# Patient Record
Sex: Male | Born: 1985 | Race: White | Hispanic: No | Marital: Single | Smoking: Never smoker
Health system: Southern US, Community
[De-identification: ages and names within clinical notes are randomized; demographics above are authoritative.]

## PROBLEM LIST (undated history)

## (undated) DIAGNOSIS — F4310 Post-traumatic stress disorder, unspecified: Secondary | ICD-10-CM

## (undated) HISTORY — PX: HERNIA REPAIR: SHX51

## (undated) HISTORY — PX: SHOULDER SURGERY: SHX246

---

## 2005-04-25 ENCOUNTER — Encounter: Admission: RE | Admit: 2005-04-25 | Discharge: 2005-05-16 | Payer: Self-pay | Admitting: Orthopedic Surgery

## 2011-10-09 ENCOUNTER — Other Ambulatory Visit (HOSPITAL_BASED_OUTPATIENT_CLINIC_OR_DEPARTMENT_OTHER): Payer: Self-pay | Admitting: Nuclear Medicine

## 2011-10-09 ENCOUNTER — Ambulatory Visit (HOSPITAL_BASED_OUTPATIENT_CLINIC_OR_DEPARTMENT_OTHER)
Admission: RE | Admit: 2011-10-09 | Discharge: 2011-10-09 | Disposition: A | Discharge status: Home / Self Care | Payer: BC Managed Care – PPO | Source: Ambulatory Visit | Attending: Nuclear Medicine | Admitting: Nuclear Medicine

## 2011-10-09 DIAGNOSIS — M549 Dorsalgia, unspecified: Secondary | ICD-10-CM

## 2011-10-09 DIAGNOSIS — R350 Frequency of micturition: Secondary | ICD-10-CM

## 2013-08-24 ENCOUNTER — Ambulatory Visit (HOSPITAL_BASED_OUTPATIENT_CLINIC_OR_DEPARTMENT_OTHER)
Admission: RE | Admit: 2013-08-24 | Discharge: 2013-08-24 | Disposition: A | Discharge status: Home / Self Care | Payer: BC Managed Care – PPO | Source: Ambulatory Visit | Attending: Osteopathic Medicine | Admitting: Osteopathic Medicine

## 2013-08-24 ENCOUNTER — Other Ambulatory Visit (HOSPITAL_BASED_OUTPATIENT_CLINIC_OR_DEPARTMENT_OTHER): Payer: Self-pay | Admitting: Osteopathic Medicine

## 2013-08-24 DIAGNOSIS — R52 Pain, unspecified: Secondary | ICD-10-CM

## 2013-08-24 DIAGNOSIS — M948X9 Other specified disorders of cartilage, unspecified sites: Secondary | ICD-10-CM | POA: Insufficient documentation

## 2013-08-24 DIAGNOSIS — M48061 Spinal stenosis, lumbar region without neurogenic claudication: Secondary | ICD-10-CM | POA: Insufficient documentation

## 2013-08-24 DIAGNOSIS — Q762 Congenital spondylolisthesis: Secondary | ICD-10-CM | POA: Insufficient documentation

## 2013-08-24 DIAGNOSIS — M461 Sacroiliitis, not elsewhere classified: Secondary | ICD-10-CM | POA: Insufficient documentation

## 2014-02-25 ENCOUNTER — Ambulatory Visit: Payer: BC Managed Care – PPO | Attending: Neurology | Admitting: Rehabilitation

## 2014-05-19 ENCOUNTER — Emergency Department (HOSPITAL_COMMUNITY): Payer: BC Managed Care – PPO

## 2014-05-19 ENCOUNTER — Encounter (HOSPITAL_COMMUNITY): Payer: Self-pay | Admitting: Emergency Medicine

## 2014-05-19 ENCOUNTER — Emergency Department (HOSPITAL_COMMUNITY)
Admission: EM | Admit: 2014-05-19 | Discharge: 2014-05-19 | Disposition: A | Discharge status: Home / Self Care | Payer: BC Managed Care – PPO | Attending: Emergency Medicine | Admitting: Emergency Medicine

## 2014-05-19 DIAGNOSIS — R112 Nausea with vomiting, unspecified: Secondary | ICD-10-CM | POA: Diagnosis not present

## 2014-05-19 DIAGNOSIS — N200 Calculus of kidney: Secondary | ICD-10-CM

## 2014-05-19 DIAGNOSIS — R1031 Right lower quadrant pain: Secondary | ICD-10-CM | POA: Diagnosis present

## 2014-05-19 DIAGNOSIS — Z79899 Other long term (current) drug therapy: Secondary | ICD-10-CM | POA: Insufficient documentation

## 2014-05-19 LAB — CBC WITH DIFFERENTIAL/PLATELET
Basophils Absolute: 0 10*3/uL (ref 0.0–0.1)
Basophils Relative: 0 % (ref 0–1)
EOS PCT: 11 % — AB (ref 0–5)
Eosinophils Absolute: 0.3 10*3/uL (ref 0.0–0.7)
HEMATOCRIT: 44.9 % (ref 39.0–52.0)
Hemoglobin: 15.8 g/dL (ref 13.0–17.0)
LYMPHS ABS: 1.5 10*3/uL (ref 0.7–4.0)
Lymphocytes Relative: 55 % — ABNORMAL HIGH (ref 12–46)
MCH: 29.1 pg (ref 26.0–34.0)
MCHC: 35.2 g/dL (ref 30.0–36.0)
MCV: 82.7 fL (ref 78.0–100.0)
MONOS PCT: 9 % (ref 3–12)
Monocytes Absolute: 0.2 10*3/uL (ref 0.1–1.0)
NEUTROS ABS: 0.7 10*3/uL — AB (ref 1.7–7.7)
Neutrophils Relative %: 25 % — ABNORMAL LOW (ref 43–77)
Platelets: 221 10*3/uL (ref 150–400)
RBC: 5.43 MIL/uL (ref 4.22–5.81)
RDW: 12.7 % (ref 11.5–15.5)
WBC: 2.7 10*3/uL — ABNORMAL LOW (ref 4.0–10.5)

## 2014-05-19 LAB — COMPREHENSIVE METABOLIC PANEL
ALT: 39 U/L (ref 0–53)
AST: 26 U/L (ref 0–37)
Albumin: 4.3 g/dL (ref 3.5–5.2)
Alkaline Phosphatase: 69 U/L (ref 39–117)
Anion gap: 13 (ref 5–15)
BILIRUBIN TOTAL: 0.8 mg/dL (ref 0.3–1.2)
BUN: 17 mg/dL (ref 6–23)
CHLORIDE: 101 meq/L (ref 96–112)
CO2: 26 mEq/L (ref 19–32)
Calcium: 9.5 mg/dL (ref 8.4–10.5)
Creatinine, Ser: 1.4 mg/dL — ABNORMAL HIGH (ref 0.50–1.35)
GFR calc non Af Amer: 67 mL/min — ABNORMAL LOW (ref >90)
GFR, EST AFRICAN AMERICAN: 78 mL/min — AB (ref >90)
Glucose, Bld: 158 mg/dL — ABNORMAL HIGH (ref 70–99)
Potassium: 4.5 mEq/L (ref 3.7–5.3)
Sodium: 140 mEq/L (ref 137–147)
Total Protein: 7.2 g/dL (ref 6.0–8.3)

## 2014-05-19 LAB — URINALYSIS, ROUTINE W REFLEX MICROSCOPIC
BILIRUBIN URINE: NEGATIVE
Glucose, UA: NEGATIVE mg/dL
KETONES UR: NEGATIVE mg/dL
Leukocytes, UA: NEGATIVE
NITRITE: NEGATIVE
PH: 8 (ref 5.0–8.0)
Protein, ur: NEGATIVE mg/dL
Specific Gravity, Urine: 1.019 (ref 1.005–1.030)
Urobilinogen, UA: 1 mg/dL (ref 0.0–1.0)

## 2014-05-19 LAB — PATHOLOGIST SMEAR REVIEW

## 2014-05-19 LAB — LIPASE, BLOOD: Lipase: 20 U/L (ref 11–59)

## 2014-05-19 LAB — URINE MICROSCOPIC-ADD ON

## 2014-05-19 MED ORDER — SODIUM CHLORIDE 0.9 % IV BOLUS (SEPSIS)
1000.0000 mL | Freq: Once | INTRAVENOUS | Status: AC
  Administered 2014-05-19: 1000 mL via INTRAVENOUS

## 2014-05-19 MED ORDER — KETOROLAC TROMETHAMINE 30 MG/ML IJ SOLN
30.0000 mg | Freq: Once | INTRAMUSCULAR | Status: AC
  Administered 2014-05-19: 30 mg via INTRAVENOUS
  Filled 2014-05-19: qty 1

## 2014-05-19 MED ORDER — HYDROMORPHONE HCL PF 1 MG/ML IJ SOLN
1.0000 mg | Freq: Once | INTRAMUSCULAR | Status: AC
  Administered 2014-05-19: 1 mg via INTRAVENOUS
  Filled 2014-05-19: qty 1

## 2014-05-19 MED ORDER — HYDROCODONE-ACETAMINOPHEN 5-325 MG PO TABS: 1.0000 | ORAL_TABLET | ORAL | Status: DC | PRN

## 2014-05-19 MED ORDER — ONDANSETRON HCL 4 MG/2ML IJ SOLN
4.0000 mg | Freq: Once | INTRAMUSCULAR | Status: AC
  Administered 2014-05-19: 4 mg via INTRAVENOUS
  Filled 2014-05-19: qty 2

## 2014-05-19 MED ORDER — IOHEXOL 300 MG/ML  SOLN: 25.0000 mL | Freq: Once | INTRAMUSCULAR | Status: DC | PRN

## 2014-05-19 MED ORDER — ONDANSETRON 4 MG PO TBDP: 4.0000 mg | ORAL_TABLET | Freq: Three times a day (TID) | ORAL | Status: DC | PRN

## 2014-05-19 NOTE — ED Provider Notes (Signed)
CSN: [id number]     Arrival date & time 05/19/14  0755 History   First MD Initiated Contact with Patient 05/19/14 [phone]     Chief Complaint  Patient presents with  . Abdominal Pain     (Consider location/radiation/quality/duration/timing/severity/associated sxs/prior Treatment) Patient is a 28 y.o. male presenting with abdominal pain. The history is provided by the patient.  Abdominal Pain Pain location:  RLQ Pain quality: sharp and shooting   Pain radiates to:  Does not radiate Pain severity:  Severe Onset quality:  Sudden Duration:  1 hour Timing:  Constant Progression:  Unchanged Chronicity:  New Relieved by:  Nothing Worsened by:  Nothing tried Ineffective treatments:  None tried Associated symptoms: nausea and vomiting   Associated symptoms: no chest pain, no chills, no diarrhea, no fever and no shortness of breath   Risk factors: not obese     28 yo M with a chief complaint right lower quadrant pain. Patient said this pain started abruptly about an hour ago. The pain is sharp severe and doesn't change with anything. Patient with radiation to R flank. Pain worse when he moves around. Patient denies history of kidney stones. Last bowel movement was yesterday and normal. Patient with one episode of emesis upon arrival. Denies history of abdominal surgery. Patient unable to lay still, denies fever, chills.    History reviewed. No pertinent past medical history. Past Surgical History  Procedure Laterality Date  . Shoulder surgery     No family history on file. History  Substance Use Topics  . Smoking status: Never Smoker   . Smokeless tobacco: Not on file  . Alcohol Use: No    Review of Systems  Constitutional: Negative for fever and chills.  HENT: Negative for congestion and facial swelling.   Eyes: Negative for discharge and visual disturbance.  Respiratory: Negative for shortness of breath.   Cardiovascular: Negative for chest pain and palpitations.   Gastrointestinal: Positive for nausea and vomiting. Negative for abdominal pain and diarrhea.  Genitourinary: Negative for penile pain and testicular pain.  Musculoskeletal: Negative for arthralgias and myalgias.  Skin: Negative for color change and rash.  Neurological: Negative for tremors, syncope and headaches.  Psychiatric/Behavioral: Negative for confusion and dysphoric mood.      Allergies  Review of patient's allergies indicates no known allergies.  Home Medications   Prior to Admission medications   Medication Sig Start Date End Date Taking? Authorizing Provider  buPROPion (WELLBUTRIN) 100 MG tablet Take 100 mg by mouth 2 (two) times daily. 03/18/14  Yes Historical Provider, MD  HYDROcodone-acetaminophen (NORCO/VICODIN) 5-325 MG per tablet Take 1 tablet by mouth every 4 (four) hours as needed for moderate pain or severe pain. 05/19/14   Melene Plan, MD  ondansetron (ZOFRAN ODT) 4 MG disintegrating tablet Take 1 tablet (4 mg total) by mouth every 8 (eight) hours as needed for nausea or vomiting. 05/19/14   Melene Plan, MD   BP 91/50  Pulse 55  Temp(Src) 97.9 F (36.6 C) (Oral)  Resp 14  SpO2 97% Physical Exam  Constitutional: He is oriented to person, place, and time. He appears well-developed and well-nourished.  HENT:  Head: Normocephalic and atraumatic.  Eyes: EOM are normal. Pupils are equal, round, and reactive to light.  Neck: Normal range of motion. Neck supple. No JVD present.  Cardiovascular: Normal rate and regular rhythm.  Exam reveals no gallop and no friction rub.   No murmur heard. Pulmonary/Chest: No respiratory distress. He has no wheezes.  Abdominal:  He exhibits no distension. There is tenderness (RLQ worst). There is guarding. There is no rebound.  +rosvigs, +psoas.   Musculoskeletal: Normal range of motion.  Neurological: He is alert and oriented to person, place, and time.  Skin: No rash noted. No pallor.  Psychiatric: He has a normal mood and affect.  His behavior is normal.    ED Course  Procedures (including critical care time) Labs Review Labs Reviewed  CBC WITH DIFFERENTIAL - Abnormal; Notable for the following:    WBC 2.7 (*)    Neutrophils Relative % 25 (*)    Lymphocytes Relative 55 (*)    Eosinophils Relative 11 (*)    Neutro Abs 0.7 (*)    All other components within normal limits  COMPREHENSIVE METABOLIC PANEL - Abnormal; Notable for the following:    Glucose, Bld 158 (*)    Creatinine, Ser 1.40 (*)    GFR calc non Af Amer 67 (*)    GFR calc Af Amer 78 (*)    All other components within normal limits  URINALYSIS, ROUTINE W REFLEX MICROSCOPIC - Abnormal; Notable for the following:    Hgb urine dipstick LARGE (*)    All other components within normal limits  LIPASE, BLOOD  URINE MICROSCOPIC-ADD ON    Imaging Review Ct Renal Stone Study  05/19/2014   CLINICAL DATA:  Right lower quadrant pain  EXAM: CT RENAL STONE PROTOCOL  TECHNIQUE: Multidetector CT imaging of the abdomen and pelvis was performed following the standard protocol without intravenous contrast  COMPARISON:  08/24/2013  FINDINGS: Lung bases are unremarkable. 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 Unenhanced liver shows no biliary ductal dilatation. No calcified gallstones are noted within gallbladder. Pancreas, spleen and adrenals are unremarkable. Kidneys are symmetrical in size. No nephrolithiasis. No hydronephrosis. No hydroureter. No proximal ureteral calculi. Normal retrocecal appendix. No pericecal inflammation. No small bowel obstruction. No ascites or free air. No adenopathy. No aortic aneurysm.  Axial image 72 minimal distension of distal right ureter. There is 3.9 mm nonobstructive calcified calculus in distal right ureter about 1 cm from right UVJ. Best seen in coronal image 45. No calcified calculi are noted within urinary bladder. No inguinal adenopathy. Prostate gland and seminal vesicles are unremarkable. No pelvic ascites or adenopathy. No destructive  bony lesions are noted within pelvis.  IMPRESSION: 1. No hydronephrosis.  No nephrolithiasis. 2. There is 3.9 mm nonobstructive calcified calculus in distal right ureter about 1 cm from right UVJ. 3. Normal retrocecal appendix.  No pericecal inflammation. 4. No small bowel obstruction. 5. 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   Electronically Signed   By: Natasha MeadLiviu  Pop M.D.   On: 05/19/2014 09:00     EKG Interpretation None      MDM   Final diagnoses:  Nephrolithiasis    28 yo M with abrupt onset constant right lower quadrant pain. Patient with positive psoas sign patient with positive Rosvigs sign. Concern for appendicitis/stone more likely stone with radiation to R flank and unable to stay still. we'll obtain CT stone study as well as CBC CMP lipase give fluid bolus treating with dilaudid.  3.9 mm stone at the UPJ. Patient's pain improved with pain medicines white count 2.7. we'll obtain UA to rule out concurrent UTI.  UA negative patient will be discharged home.  Pain well controlled here. We'll send home with Norco patient will follow up with urology.  Sent home with strainer.   11:40 AM:  I have discussed the  diagnosis/risks/treatment options with the patient and family and believe the pt to be eligible for discharge home to follow-up with Urology. We also discussed returning to the ED immediately if new or worsening sx occur. We discussed the sx which are most concerning (e.g., sudden worsening pain, fever) that necessitate immediate return. Medications administered to the patient during their visit and any new prescriptions provided to the patient are listed below.  Medications given during this visit Medications  iohexol (OMNIPAQUE) 300 MG/ML solution 25 mL (not administered)  HYDROmorphone (DILAUDID) injection 1 mg (1 mg Intravenous Given 05/19/14 0815)  ondansetron (ZOFRAN) injection 4 mg (4 mg Intravenous Given 05/19/14 0814)  sodium chloride 0.9 % bolus 1,000 mL (0 mLs  Intravenous Stopped 05/19/14 0913)  HYDROmorphone (DILAUDID) injection 1 mg (1 mg Intravenous Given 05/19/14 0906)  ketorolac (TORADOL) 30 MG/ML injection 30 mg (30 mg Intravenous Given 05/19/14 0913)  sodium chloride 0.9 % bolus 1,000 mL (0 mLs Intravenous Stopped 05/19/14 0946)    New Prescriptions   HYDROCODONE-ACETAMINOPHEN (NORCO/VICODIN) 5-325 MG PER TABLET    Take 1 tablet by mouth every 4 (four) hours as needed for moderate pain or severe pain.   ONDANSETRON (ZOFRAN ODT) 4 MG DISINTEGRATING TABLET    Take 1 tablet (4 mg total) by mouth every 8 (eight) hours as needed for nausea or vomiting.     Melene Plan, MD 05/19/14 1140

## 2014-05-19 NOTE — Discharge Instructions (Signed)
Take 4 over the counter ibuprofen tablets 3 times a day or 2 over-the-counter naproxen tablets twice a day for pain.  Kidney Stones Kidney stones (urolithiasis) are deposits that form inside your kidneys. The intense pain is caused by the stone moving through the urinary tract. When the stone moves, the ureter goes into spasm around the stone. The stone is usually passed in the urine.  CAUSES   A disorder that makes certain neck glands produce too much parathyroid hormone (primary hyperparathyroidism).  A buildup of uric acid crystals, similar to gout in your joints.  Narrowing (stricture) of the ureter.  A kidney obstruction present at birth (congenital obstruction).  Previous surgery on the kidney or ureters.  Numerous kidney infections. SYMPTOMS   Feeling sick to your stomach (nauseous).  Throwing up (vomiting).  Blood in the urine (hematuria).  Pain that usually spreads (radiates) to the groin.  Frequency or urgency of urination. DIAGNOSIS   Taking a history and physical exam.  Blood or urine tests.  CT scan.  Occasionally, an examination of the inside of the urinary bladder (cystoscopy) is performed. TREATMENT   Observation.  Increasing your fluid intake.  Extracorporeal shock wave lithotripsy--This is a noninvasive procedure that uses shock waves to break up kidney stones.  Surgery may be needed if you have severe pain or persistent obstruction. There are various surgical procedures. Most of the procedures are performed with the use of small instruments. Only small incisions are needed to accommodate these instruments, so recovery time is minimized. The size, location, and chemical composition are all important variables that will determine the proper choice of action for you. Talk to your health care provider to better understand your situation so that you will minimize the risk of injury to yourself and your kidney.  HOME CARE INSTRUCTIONS   Drink enough water  and fluids to keep your urine clear or pale yellow. This will help you to pass the stone or stone fragments.  Strain all urine through the provided strainer. Keep all particulate matter and stones for your health care provider to see. The stone causing the pain may be as small as a grain of salt. It is very important to use the strainer each and every time you pass your urine. The collection of your stone will allow your health care provider to analyze it and verify that a stone has actually passed. The stone analysis will often identify what you can do to reduce the incidence of recurrences.  Only take over-the-counter or prescription medicines for pain, discomfort, or fever as directed by your health care provider.  Make a follow-up appointment with your health care provider as directed.  Get follow-up X-rays if required. The absence of pain does not always mean that the stone has passed. It may have only stopped moving. If the urine remains completely obstructed, it can cause loss of kidney function or even complete destruction of the kidney. It is your responsibility to make sure X-rays and follow-ups are completed. Ultrasounds of the kidney can show blockages and the status of the kidney. Ultrasounds are not associated with any radiation and can be performed easily in a matter of minutes. SEEK MEDICAL CARE IF:  You experience pain that is progressive and unresponsive to any pain medicine you have been prescribed. SEEK IMMEDIATE MEDICAL CARE IF:   Pain cannot be controlled with the prescribed medicine.  You have a fever or shaking chills.  The severity or intensity of pain increases over 18 hours and  is not relieved by pain medicine.  You develop a new onset of abdominal pain.  You feel faint or pass out.  You are unable to urinate. MAKE SURE YOU:   Understand these instructions.  Will watch your condition.  Will get help right away if you are not doing well or get worse. Document  Released: 09/25/2005 Document Revised: 05/28/2013 Document Reviewed: 02/26/2013 [facility] Patient Information 2015 [place], [place]. This information is not intended to replace advice given to you by your health care provider. Make sure you discuss any questions you have with your health care provider.

## 2014-05-19 NOTE — ED Notes (Signed)
Per EMS- Pt comes from work c/o RLQ pain since 0700 this morning. Nausea, vomiting, pt was initially diaphoretic. Describes the pain as burning, reports hasn't urinated this morning. Was given 250 fentanyl with no relief, 4 mg zofran. BP 148/98.

## 2014-05-19 NOTE — ED Notes (Signed)
Patient transported to CT 

## 2014-05-26 NOTE — ED Provider Notes (Signed)
I saw and evaluated the patient, reviewed the resident's note and I agree with the findings and plan.   EKG Interpretation None     Suspect new onset renal colic pre-CT.  Hurman HornJohn M Nancy Arvin, MD 05/26/14 [phone]

## 2014-09-24 ENCOUNTER — Encounter (HOSPITAL_COMMUNITY): Payer: Self-pay | Admitting: Cardiology

## 2014-09-24 ENCOUNTER — Emergency Department (HOSPITAL_COMMUNITY)
Admission: EM | Admit: 2014-09-24 | Discharge: 2014-09-24 | Disposition: A | Discharge status: Home / Self Care | Payer: BC Managed Care – PPO | Attending: Emergency Medicine | Admitting: Emergency Medicine

## 2014-09-24 DIAGNOSIS — R369 Urethral discharge, unspecified: Secondary | ICD-10-CM | POA: Diagnosis not present

## 2014-09-24 DIAGNOSIS — M545 Low back pain, unspecified: Secondary | ICD-10-CM

## 2014-09-24 LAB — COMPREHENSIVE METABOLIC PANEL
ALK PHOS: 74 U/L (ref 39–117)
ALT: 22 U/L (ref 0–53)
ANION GAP: 12 (ref 5–15)
AST: 15 U/L (ref 0–37)
Albumin: 4.6 g/dL (ref 3.5–5.2)
BILIRUBIN TOTAL: 0.6 mg/dL (ref 0.3–1.2)
BUN: 14 mg/dL (ref 6–23)
CHLORIDE: 100 meq/L (ref 96–112)
CO2: 26 meq/L (ref 19–32)
Calcium: 9.7 mg/dL (ref 8.4–10.5)
Creatinine, Ser: 1.04 mg/dL (ref 0.50–1.35)
GLUCOSE: 69 mg/dL — AB (ref 70–99)
POTASSIUM: 4.3 meq/L (ref 3.7–5.3)
Sodium: 138 mEq/L (ref 137–147)
Total Protein: 7.7 g/dL (ref 6.0–8.3)

## 2014-09-24 LAB — CBC WITH DIFFERENTIAL/PLATELET
Basophils Absolute: 0 10*3/uL (ref 0.0–0.1)
Basophils Relative: 1 % (ref 0–1)
Eosinophils Absolute: 0.3 10*3/uL (ref 0.0–0.7)
Eosinophils Relative: 6 % — ABNORMAL HIGH (ref 0–5)
HCT: 43.7 % (ref 39.0–52.0)
HEMOGLOBIN: 15.3 g/dL (ref 13.0–17.0)
LYMPHS ABS: 1.9 10*3/uL (ref 0.7–4.0)
Lymphocytes Relative: 44 % (ref 12–46)
MCH: 28.9 pg (ref 26.0–34.0)
MCHC: 35 g/dL (ref 30.0–36.0)
MCV: 82.6 fL (ref 78.0–100.0)
MONOS PCT: 6 % (ref 3–12)
Monocytes Absolute: 0.2 10*3/uL (ref 0.1–1.0)
NEUTROS PCT: 43 % (ref 43–77)
Neutro Abs: 1.8 10*3/uL (ref 1.7–7.7)
Platelets: 276 10*3/uL (ref 150–400)
RBC: 5.29 MIL/uL (ref 4.22–5.81)
RDW: 12.8 % (ref 11.5–15.5)
WBC: 4.2 10*3/uL (ref 4.0–10.5)

## 2014-09-24 LAB — URINALYSIS, ROUTINE W REFLEX MICROSCOPIC
BILIRUBIN URINE: NEGATIVE
Glucose, UA: NEGATIVE mg/dL
Hgb urine dipstick: NEGATIVE
Ketones, ur: NEGATIVE mg/dL
Leukocytes, UA: NEGATIVE
NITRITE: NEGATIVE
PROTEIN: NEGATIVE mg/dL
Specific Gravity, Urine: 1.003 — ABNORMAL LOW (ref 1.005–1.030)
UROBILINOGEN UA: 0.2 mg/dL (ref 0.0–1.0)
pH: 6 (ref 5.0–8.0)

## 2014-09-24 MED ORDER — AZITHROMYCIN 250 MG PO TABS
1000.0000 mg | ORAL_TABLET | Freq: Once | ORAL | Status: AC
  Administered 2014-09-24: 1000 mg via ORAL
  Filled 2014-09-24: qty 4

## 2014-09-24 MED ORDER — CEFTRIAXONE SODIUM 250 MG IJ SOLR: 250.0000 mg | Freq: Once | INTRAMUSCULAR | Status: DC

## 2014-09-24 MED ORDER — SODIUM CHLORIDE 0.9 % IV BOLUS (SEPSIS)
1000.0000 mL | Freq: Once | INTRAVENOUS | Status: AC
  Administered 2014-09-24: 1000 mL via INTRAVENOUS

## 2014-09-24 MED ORDER — CEFTRIAXONE SODIUM 500 MG IJ SOLR
250.0000 mg | Freq: Once | INTRAMUSCULAR | Status: AC
  Administered 2014-09-24: 250 mg via INTRAVENOUS
  Filled 2014-09-24: qty 250

## 2014-09-24 MED ORDER — FENTANYL CITRATE 0.05 MG/ML IJ SOLN
100.0000 ug | Freq: Once | INTRAMUSCULAR | Status: AC
  Administered 2014-09-24: 100 ug via INTRAVENOUS
  Filled 2014-09-24: qty 2

## 2014-09-24 MED ORDER — HYDROCODONE-ACETAMINOPHEN 5-325 MG PO TABS: 1.0000 | ORAL_TABLET | ORAL | Status: DC | PRN

## 2014-09-24 MED ORDER — IBUPROFEN 800 MG PO TABS: 800.0000 mg | ORAL_TABLET | Freq: Three times a day (TID) | ORAL | Status: DC

## 2014-09-24 MED ORDER — TAMSULOSIN HCL 0.4 MG PO CAPS
0.4000 mg | ORAL_CAPSULE | Freq: Every day | ORAL | Status: DC
Start: 2014-09-24 — End: 2019-08-30

## 2014-09-24 NOTE — ED Notes (Signed)
Pt reports left sided back/flank pain for the past couple of weeks. Reports that he has a kidney stone in the past, but did not pass it. Reports that he has also been nauseated.

## 2014-09-24 NOTE — ED Provider Notes (Signed)
CSN: [id number]     Arrival date & time 09/24/14  1405 History   First MD Initiated Contact with Patient 09/24/14 1528     Chief Complaint  Patient presents with  . Back Pain   HPI  Patient is a 28 year old male who presents emergency room for evaluation of left sided low back pain. Patient states that this started today. He has no injury that he can think of. He states that the pain is constant and feels like a sharp stabbing sensation. It is worse when he is walking. He states that he is having pain that is almost identical to when he had his last kidney stone. He followed up with a urologist in [place] and was given Flomax and a strainer at that time which relieved some of the symptoms. He has not had any issues since then. Patient does admit to some penile discharge the past couple days. He states that the discharge is clear. He denies any changes in sexual partners. He states he was recently tested for STDs which were negative. Patient denies a previous history of STDs. Patient denies any penile rash, testicular pain, abdominal pain. Patient states that he does have some chronic low back issues. He states that he was told that he had severe low back problems. Chart review reveals a CT of the abdomen and pelvis in 2014 which reveals pars defects and L4-L5 and L5-S1. Her likely some degenerative changes as well. No kidney stones were seen at that time. Patient is requesting referral information to somebody who "knows what he is doing". He feels that his primary care doctor does not know what he is doing. Further chart review reveals that the patient had a 3.4 mm nonobstructing stone in 05/19/2014.  History reviewed. No pertinent past medical history. Past Surgical History  Procedure Laterality Date  . Shoulder surgery     History reviewed. No pertinent family history. History  Substance Use Topics  . Smoking status: Never Smoker   . Smokeless tobacco: Not on file  . Alcohol Use: No     Review of Systems  Constitutional: Negative for fever, chills and fatigue.  Respiratory: Negative for chest tightness and shortness of breath.   Cardiovascular: Negative for chest pain, palpitations and leg swelling.  Gastrointestinal: Positive for nausea. Negative for vomiting, abdominal pain, diarrhea, constipation, blood in stool, abdominal distention and anal bleeding.  Genitourinary: Positive for dysuria, urgency, frequency, flank pain and discharge. Negative for hematuria, penile swelling, scrotal swelling, difficulty urinating, penile pain and testicular pain.  Musculoskeletal: Positive for back pain. Negative for myalgias, joint swelling, arthralgias, gait problem, neck pain and neck stiffness.  Skin: Negative for rash.  All other systems reviewed and are negative.     Allergies  Review of patient's allergies indicates no known allergies.  Home Medications   Prior to Admission medications   Medication Sig Start Date End Date Taking? Authorizing Provider  FLUVIRIN 0.5 ML SUSY Inject 0.5 mLs as directed once. 07/29/14  Yes Historical Provider, MD  HYDROcodone-acetaminophen (NORCO/VICODIN) 5-325 MG per tablet Take 1 tablet by mouth every 4 (four) hours as needed for moderate pain or severe pain. 09/24/14   Marquay Kruse A Forcucci, PA-C  ibuprofen (ADVIL,MOTRIN) 800 MG tablet Take 1 tablet (800 mg total) by mouth 3 (three) times daily. 09/24/14   Teigan Sahli A Forcucci, PA-C  ondansetron (ZOFRAN ODT) 4 MG disintegrating tablet Take 1 tablet (4 mg total) by mouth every 8 (eight) hours as needed for nausea or vomiting. Patient not  taking: Reported on 09/24/2014 05/19/14   Melene Plan, MD  tamsulosin (FLOMAX) 0.4 MG CAPS capsule Take 1 capsule (0.4 mg total) by mouth daily. 09/24/14   Keirstyn Aydt A Forcucci, PA-C   BP 111/71 mmHg  Pulse 48  Temp(Src) 98.1 F (36.7 C) (Oral)  Resp 18  Ht 5\' 8"  (1.727 m)  Wt 155 lb (70.308 kg)  BMI 23.57 kg/m2  SpO2 99% Physical Exam  Constitutional: He  is oriented to person, place, and time. He appears well-developed and well-nourished. No distress.  HENT:  Head: Normocephalic and atraumatic.  Mouth/Throat: Oropharynx is clear and moist. No oropharyngeal exudate.  Eyes: Conjunctivae and EOM are normal. Pupils are equal, round, and reactive to light. No scleral icterus.  Neck: Normal range of motion. Neck supple. No JVD present. No thyromegaly present.  Cardiovascular: Normal rate, regular rhythm, normal heart sounds and intact distal pulses.  Exam reveals no gallop and no friction rub.   No murmur heard. Pulmonary/Chest: Effort normal and breath sounds normal. No respiratory distress. He has no wheezes. He has no rales. He exhibits no tenderness.  Abdominal: Soft. Normal appearance and bowel sounds are normal. He exhibits no distension and no mass. There is generalized tenderness. There is CVA tenderness (LEFT cva TENDERNESS). There is no rebound and no guarding.  Musculoskeletal:  Patient rises slowly from sitting to standing.  They walk without an antalgic gait.  There is no evidence of erythema, ecchymosis, or gross deformity.  There is tenderness to palpation over lumbar bony spine and left lumbar paraspinal muscles.  Sensation to light touch is intact over all extremities.  Strength is symmetric and equal in all extremities.   Lymphadenopathy:    He has no cervical adenopathy.  Neurological: He is alert and oriented to person, place, and time.  Skin: Skin is warm and dry. He is not diaphoretic.  Psychiatric: He has a normal mood and affect. His behavior is normal. Judgment and thought content normal.  Nursing note and vitals reviewed.   ED Course  Procedures (including critical care time) Labs Review Labs Reviewed  CBC WITH DIFFERENTIAL - Abnormal; Notable for the following:    Eosinophils Relative 6 (*)    All other components within normal limits  COMPREHENSIVE METABOLIC PANEL - Abnormal; Notable for the following:    Glucose,  Bld 69 (*)    All other components within normal limits  URINALYSIS, ROUTINE W REFLEX MICROSCOPIC - Abnormal; Notable for the following:    Specific Gravity, Urine 1.003 (*)    All other components within normal limits  GC/CHLAMYDIA PROBE AMP  HIV ANTIBODY (ROUTINE TESTING)  RPR    Imaging Review No results found.   EKG Interpretation None      MDM   Final diagnoses:  Penile discharge  Left-sided low back pain without sciatica   Patient is a 28 year old male who presents emergency room for evaluation of low back pain. Patient states that pain is similar to his previous kidney stone. There is tenderness over the left lumbar paraspinal muscles and spine. Urine is negative for hematuria. Blood work is within normal limits. GC chlamydia is pending given penile discharge. HIV and RPR are also pending. Do not feel that imaging is required at this time. Patient given azithromycin and ceftriaxone here. Will discharge patient home with hydrocodone, ibuprofen, and Flomax. Patient to follow up with [facility]. Given referral information here. Patient is stable for discharge at this time. With Dr. Rosalia Hammers who agrees with the above workup and  plan.  Eben Burowourtney A Forcucci, PA-C 09/24/14 [id number]  Hilario Quarryanielle S Ray, MD 09/26/14 [phone]

## 2014-09-24 NOTE — Discharge Instructions (Signed)
Kidney Stones Kidney stones (urolithiasis) are solid masses that form inside your kidneys. The intense pain is caused by the stone moving through the kidney, ureter, bladder, and urethra (urinary tract). When the stone moves, the ureter starts to spasm around the stone. The stone is usually passed in your pee (urine).  HOME CARE  Drink enough fluids to keep your pee clear or pale yellow. This helps to get the stone out.  Strain all pee through the provided strainer. Do not pee without peeing through the strainer, not even once. If you pee the stone out, catch it in the strainer. The stone may be as small as a grain of salt. Take this to your doctor. This will help your doctor figure out what you can do to try to prevent more kidney stones.  Only take medicine as told by your doctor.  Follow up with your doctor as told.  Get follow-up X-rays as told by your doctor. GET HELP IF: You have pain that gets worse even if you have been taking pain medicine. GET HELP RIGHT AWAY IF:   Your pain does not get better with medicine.  You have a fever or shaking chills.  Your pain increases and gets worse over 18 hours.  You have new belly (abdominal) pain.  You feel faint or pass out.  You are unable to pee. MAKE SURE YOU:   Understand these instructions.  Will watch your condition.  Will get help right away if you are not doing well or get worse. Document Released: 03/13/2008 Document Revised: 05/28/2013 Document Reviewed: 02/26/2013 [facility] Patient Information 2015 [place], [place]. This information is not intended to replace advice given to you by your health care provider. Make sure you discuss any questions you have with your health care provider.   Emergency Department Resource Guide 1) Find a Doctor and Pay Out of Pocket Although you won't have to find out who is covered by your insurance plan, it is a good idea to ask around and get recommendations. You will then need to call the  office and see if the doctor you have chosen will accept you as a new patient and what types of options they offer for patients who are self-pay. Some doctors offer discounts or will set up payment plans for their patients who do not have insurance, but you will need to ask so you aren't surprised when you get to your appointment.  2) Contact Your Local Health Department Not all health departments have doctors that can see patients for sick visits, but many do, so it is worth a call to see if yours does. If you don't know where your local health department is, you can check in your phone book. The CDC also has a tool to help you locate your state's health department, and many state websites also have listings of all of their local health departments.  3) Find a Walk-in Clinic If your illness is not likely to be very severe or complicated, you may want to try a walk in clinic. These are popping up all over the country in pharmacies, drugstores, and shopping centers. They're usually staffed by nurse practitioners or physician assistants that have been trained to treat common illnesses and complaints. They're usually fairly quick and inexpensive. However, if you have serious medical issues or chronic medical problems, these are probably not your best option.  No Primary Care Doctor: - Call Health Connect at  [phone] - they can help you locate a primary care  doctor that  accepts your insurance, provides certain services, etc. - Physician Referral Service- [phone]  Chronic Pain Problems: Organization         Address  Phone   Notes  [facility]  [phone] Patients need to be referred by their primary care doctor.   Medication Assistance: Organization         Address  Phone   Notes  [facility] [place] Medication [facility] [address] [place], [place] [address] [phone] --Must be a resident of [facility] [facility] [place] -- Must have NO insurance coverage  whatsoever (no Medicaid/ Medicare, etc.) -- The pt. MUST have a primary care doctor that directs their care regularly and follows them in the community   MedAssist  [phone]) [phone]   [employer]  [phone]    Agencies that provide inexpensive medical care: Organization         Address  Phone   Notes  [facility]  [phone]   [facility]    [phone]   [facility] [address] [place], [place] [address] [phone]) [phone]   [facility] [address], [place] [phone]) [phone]   Planned Parenthood    [phone]) [phone]   [facility]    [phone]   [facility]  [address], [place] Phone:  [phone]) [phone], Fax:  [phone] Hours of Operation:  9 am - 6 pm, M-F.  Also accepts Medicaid/Medicare and self-pay.  [facility]  [address], [place] Phone: [phone], Fax: [phone]. Hours of Operation:  8:30 am - 5:30 pm, M-F.  Also accepts Medicaid and self-pay.  [facility] [place] [address], [place] Phone: [phone]   [facility] [address], [place], [place] [phone], Ext. [phone] Mondays & Thursdays: 7-9 AM.  First 15 patients are seen on a first come, first serve basis.    Medicaid-accepting [facility] Providers:  Organization         Address  Phone   Notes  [facility] [address], [place] [phone] Also accepts self-pay patients.  [facility] [address], [place]  [phone]) [phone]   [facility] [address], [place] [phone]) [phone]   [facility] [address] [place] [address], [place] [phone]) [phone]   Renaye RakersVeita Bland [address], [place]   [phone] Only accepts [place] Access [place] patients after they have their name applied  to their card.   Self-Pay (no insurance) in [facility] [place]:  Organization         Address  Phone   Notes  Sickle Cell Patients, [facility] [address], [place] [phone]) [phone]   [facility] [address], [place] [phone]   [facility] [place]  [address], [place] [phone]   [facility]/Dr. Osei-Bonsu  [address] [place] [address], [place] or [phone], [place] [phone] Phone number for both [place] and [place] locations is the same.  [facility] [address], [place] [phone]   [facility] [address] [place] [address], [place] or [address] [phone]) [phone]  [facility] [address] [place] [phone], phone; [phone], fax Sees patients 1st and 3rd Saturday of every month.  Must not qualify for public or private insurance (i.e. Medicaid, Medicare, [place] Health Choice, Veterans' Benefits)  Household income should be no more than 200% of the poverty level The clinic cannot treat you if you are pregnant or think you are pregnant  Sexually transmitted diseases are not treated at the clinic.    Dental Care: Organization         Address  Phone  Notes  [facility] Department of [facility] [address] [place], [place] [phone] Accepts children up to age 72 who are enrolled in [place] or [place] Health Choice; pregnant women with a Medicaid card; and children who have applied for Medicaid or [place] Health Choice, but were declined, whose parents can pay a reduced fee at time of service.  [facility] Department of [facility]  [address], [place] [phone] Accepts children up to age 64 who are enrolled in [place] or [place] Health Choice; pregnant women with a Medicaid card; and children who have applied for Medicaid or [place]  Health Choice, but were declined, whose parents can pay a reduced fee at time of service.  [facility]  [address] [place], [place] [phone] Patients are seen by appointment only. Walk-ins are not accepted. [facility] will see patients 58 years of age and older. Monday - Tuesday (8am-5pm) Most Wednesdays (8:30-5pm) $30 per visit, cash only  [facility] Access PRO[facility]  [address], [facility] [phone] Patients are seen by appointment only. Walk-ins are not accepted. [facility] will see patients 38 years of age and older. One Wednesday Evening (Monthly: Volunteer Based).  $30 per visit, cash only  [employer]  [phone] for adults; Children under age 79, call Graduate Pediatric Dentistry at [phone]. Children aged 36-14, please call [phone] to request a pediatric application.  Dental services are provided in all areas of dental care including fillings, crowns and bridges, complete and partial dentures, implants, gum treatment, root canals, and extractions. Preventive care is also provided. Treatment is provided to both adults and children. Patients are selected via a lottery and there is often a waiting list.   [facility] [address] [place]  [phone] [email or web address]   [facility] [address] [place], [place] [phone], Ext. [phone] Second and Fourth Thursday of each month, opens at 6:30 AM; Clinic ends at 9 AM.  Patients are seen on a first-come first-served basis, and a limited number are seen during each clinic.   [facility]  [address] Ether [place]ins [place], [place] [phone]   Eligibility Requirements You must have lived in [place], [place], or [place] counties for at least the last three months.   You cannot be eligible for state or federal sponsored [place], including [employer], [place], or [employer].    You generally cannot be eligible for healthcare insurance through your employer.    How to apply: Eligibility screenings are held every Tuesday and Wednesday afternoon from 1:00 pm until 4:00 pm. You do not need an appointment for the interview!  [facility] [address], [place], [place] [phone]   [facility] Health Department  [phone]   [facility] Health Department  [phone]   [facility]  Department  [phone]    Behavioral Health Resources in the Community: Intensive Outpatient Programs Organization         Address  Phone  Notes  [facility] [address], [place], [place] [phone]   [facility] [address], [place], [place] [phone]   [facility] [address], [place], [place]  [phone]   [facility] [address],  [place], [place] [phone] or [phone]   Substance Abuse Resources Organization         Address  Phone  Notes  [facility]  [phone]   [facility]  [phone]   [facility]  [phone]   [facility]  [phone]   Residential & Outpatient Substance Abuse Program  [phone]   Psychological Services Organization         Address  Phone  Notes  [facility]  [phone]- [phone]   [facility]  [phone]- [phone]   [facility] [address], [place] [phone] or [phone]    Mobile Crisis Teams Organization         Address  Phone  Notes  [facility], Mobile Crisis Care Unit  [phone]   [facility]  [address] [place], [place] [phone]   Doristine LocksSharon DeEsch [address] [place] [phone]    Self-Help/Support Groups Organization         Address  Phone             Notes  [facility] [place] - variety of support groups  [phone] Call  for more information  Narcotics Anonymous (NA), [facility] [address], [place]  2 meetings at this location   Statisticianesidential Treatment Programs Organization         Address  Phone  Notes  [facility] [address],    [place]  [phone]   [facility]  [address], [place] [phone], [place], [place] [phone]   [facility] [address], [place] [phone] Admissions: 8am-3pm M-F  [facility] [address]    [place], [place] [phone]   [facility] [address], [place], [place] [phone]   [facility] [address]  [place], [place] [phone]   [facility] - Intensive Outpatient [address], [place], [place] [phone]   [facility] ([facility] [address]  [place], [place] [phone] or [phone]   [facility] ([facility]) [address] [place], [place] [phone] Accepts Medicaid  [facility] [address]  [place] [phone] Substance Abuse/Addiction Treatment   [facility] Resources Organization         Address  Phone  Notes  [facility]  [phone]   Angie FavaJulie Brannon, PhD [address], [address] [place], [place]   [phone](336) [phone] or [phone]   [facility]   [address] [place], [place] [phone]   [facility] [address], [place], [place] [phone] Insurance/Medicaid/sponsorship through [employer] [facility]  [facility] Families [address]                                    [place], [place] [phone] Therapy/tele-psych/case  [facility]  [address]   [place], [place] [phone]    Dr. Lolly Mustache  [phone]   [facility]  [employer] [facility] Dept. 1) [address], [place] 2) [address], [place] 3)  [address], [place]  [phone]  [phone]   [facility] Child Abuse Hotline [phone] or [phone] (After Hours)

## 2014-09-24 NOTE — ED Notes (Signed)
DENIES INJURY TO BACK. STATES HURTS TO WALK. STATES URINARY URGENCY WITH DIFFICULTY STARTING STREAM

## 2014-09-24 NOTE — ED Notes (Signed)
No adverse reaction to atb.

## 2014-09-25 LAB — RPR

## 2014-09-25 LAB — GC/CHLAMYDIA PROBE AMP
CT Probe RNA: NEGATIVE
GC Probe RNA: NEGATIVE

## 2014-09-25 LAB — HIV ANTIBODY (ROUTINE TESTING W REFLEX): HIV 1&2 Ab, 4th Generation: NONREACTIVE

## 2014-10-21 ENCOUNTER — Emergency Department (HOSPITAL_COMMUNITY)
Admission: EM | Admit: 2014-10-21 | Discharge: 2014-10-21 | Disposition: A | Discharge status: Home / Self Care | Payer: BLUE CROSS/BLUE SHIELD | Attending: Emergency Medicine | Admitting: Emergency Medicine

## 2014-10-21 ENCOUNTER — Encounter (HOSPITAL_COMMUNITY): Payer: Self-pay | Admitting: Emergency Medicine

## 2014-10-21 DIAGNOSIS — Y92410 Unspecified street and highway as the place of occurrence of the external cause: Secondary | ICD-10-CM | POA: Diagnosis not present

## 2014-10-21 DIAGNOSIS — S0990XA Unspecified injury of head, initial encounter: Secondary | ICD-10-CM | POA: Diagnosis present

## 2014-10-21 DIAGNOSIS — Y998 Other external cause status: Secondary | ICD-10-CM | POA: Insufficient documentation

## 2014-10-21 DIAGNOSIS — R519 Headache, unspecified: Secondary | ICD-10-CM

## 2014-10-21 DIAGNOSIS — Y9389 Activity, other specified: Secondary | ICD-10-CM | POA: Insufficient documentation

## 2014-10-21 DIAGNOSIS — Z79899 Other long term (current) drug therapy: Secondary | ICD-10-CM | POA: Diagnosis not present

## 2014-10-21 DIAGNOSIS — R51 Headache: Secondary | ICD-10-CM

## 2014-10-21 MED ORDER — IBUPROFEN 800 MG PO TABS
800.0000 mg | ORAL_TABLET | Freq: Once | ORAL | Status: AC
  Administered 2014-10-21: 800 mg via ORAL
  Filled 2014-10-21: qty 1

## 2014-10-21 MED ORDER — IBUPROFEN 600 MG PO TABS: 600.0000 mg | ORAL_TABLET | Freq: Four times a day (QID) | ORAL | Status: DC | PRN
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
## 2019-08-30 ENCOUNTER — Emergency Department
Admission: EM | Admit: 2019-08-30 | Discharge: 2019-08-31 | Disposition: A | Discharge status: Home / Self Care | Payer: BC Managed Care – PPO | Attending: Emergency Medicine | Admitting: Emergency Medicine

## 2019-08-30 ENCOUNTER — Other Ambulatory Visit: Payer: Self-pay

## 2019-08-30 ENCOUNTER — Encounter: Payer: Self-pay | Admitting: Intensive Care

## 2019-08-30 DIAGNOSIS — F32A Depression, unspecified: Secondary | ICD-10-CM

## 2019-08-30 DIAGNOSIS — F4310 Post-traumatic stress disorder, unspecified: Secondary | ICD-10-CM | POA: Insufficient documentation

## 2019-08-30 DIAGNOSIS — F329 Major depressive disorder, single episode, unspecified: Secondary | ICD-10-CM

## 2019-08-30 DIAGNOSIS — F419 Anxiety disorder, unspecified: Secondary | ICD-10-CM | POA: Diagnosis present

## 2019-08-30 DIAGNOSIS — F23 Brief psychotic disorder: Secondary | ICD-10-CM

## 2019-08-30 DIAGNOSIS — F4325 Adjustment disorder with mixed disturbance of emotions and conduct: Secondary | ICD-10-CM | POA: Diagnosis not present

## 2019-08-30 HISTORY — DX: Post-traumatic stress disorder, unspecified: F43.10

## 2019-08-30 LAB — COMPREHENSIVE METABOLIC PANEL
ALT: 27 U/L (ref 0–44)
AST: 20 U/L (ref 15–41)
Albumin: 5 g/dL (ref 3.5–5.0)
Alkaline Phosphatase: 65 U/L (ref 38–126)
Anion gap: 12 (ref 5–15)
BUN: 21 mg/dL — ABNORMAL HIGH (ref 6–20)
CO2: 27 mmol/L (ref 22–32)
Calcium: 10 mg/dL (ref 8.9–10.3)
Chloride: 102 mmol/L (ref 98–111)
Creatinine, Ser: 1.07 mg/dL (ref 0.61–1.24)
GFR calc Af Amer: >60 mL/min (ref >60)
GFR calc non Af Amer: >60 mL/min (ref >60)
Glucose, Bld: 113 mg/dL — ABNORMAL HIGH (ref 70–99)
Potassium: 4.4 mmol/L (ref 3.5–5.1)
Sodium: 141 mmol/L (ref 135–145)
Total Bilirubin: 1.2 mg/dL (ref 0.3–1.2)
Total Protein: 8.5 g/dL — ABNORMAL HIGH (ref 6.5–8.1)

## 2019-08-30 LAB — CBC
HCT: 46.5 % (ref 39.0–52.0)
Hemoglobin: 16.1 g/dL (ref 13.0–17.0)
MCH: 28.5 pg (ref 26.0–34.0)
MCHC: 34.6 g/dL (ref 30.0–36.0)
MCV: 82.3 fL (ref 80.0–100.0)
Platelets: 313 10*3/uL (ref 150–400)
RBC: 5.65 MIL/uL (ref 4.22–5.81)
RDW: 12.6 % (ref 11.5–15.5)
WBC: 4.3 10*3/uL (ref 4.0–10.5)
nRBC: 0 % (ref 0.0–0.2)

## 2019-08-30 LAB — ETHANOL: Alcohol, Ethyl (B): <10 mg/dL (ref <10)

## 2019-08-30 LAB — ACETAMINOPHEN LEVEL: Acetaminophen (Tylenol), Serum: <10 ug/mL — ABNORMAL LOW (ref 10–30)

## 2019-08-30 LAB — SALICYLATE LEVEL: Salicylate Lvl: <7 mg/dL (ref 2.8–30.0)

## 2019-08-30 MED ORDER — DIPHENHYDRAMINE HCL 50 MG/ML IJ SOLN
INTRAMUSCULAR | Status: AC
  Filled 2019-08-30: qty 1

## 2019-08-30 MED ORDER — DIAZEPAM 5 MG PO TABS
10.0000 mg | ORAL_TABLET | Freq: Once | ORAL | Status: AC
  Administered 2019-08-30: 20:00:00 10 mg via ORAL
  Filled 2019-08-30: qty 2

## 2019-08-30 MED ORDER — HALOPERIDOL LACTATE 5 MG/ML IJ SOLN
5.0000 mg | Freq: Once | INTRAMUSCULAR | Status: AC
  Administered 2019-08-30: 5 mg via INTRAMUSCULAR
  Filled 2019-08-30: qty 1

## 2019-08-30 MED ORDER — LORAZEPAM 2 MG/ML IJ SOLN
INTRAMUSCULAR | Status: AC
  Filled 2019-08-30: qty 1

## 2019-08-30 MED ORDER — HALOPERIDOL LACTATE 5 MG/ML IJ SOLN
INTRAMUSCULAR | Status: AC
  Filled 2019-08-30: qty 1

## 2019-08-30 NOTE — ED Notes (Signed)
Pt. Transferred to [place] from ED to room 1 after screening for contraband. Report to include Situation, Background, Assessment and Recommendations from RN. Pt. Oriented to unit including Q15 minute rounds as well as the security cameras for their protection. Patient is alert and oriented, warm and dry in no acute distress. Patient denies SI, HI, and AVH but remains manic angry and slamming his fist on the bed. Pt. Encouraged to let me know if needs arise.

## 2019-08-30 NOTE — ED Notes (Signed)
Hourly rounding reveals patient sleeping in room. No complaints, stable, in no acute distress. Q15 minute rounds and monitoring via Security Cameras to continue. 
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
## 2019-08-30 NOTE — ED Notes (Signed)
In patient belongings bag: Hat, safety glasses, jacket, t-shirt, boots, belt, grey pants, wallet- $391 in cash, keys, tobacco, cell phone, boxers, socks
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
## 2019-08-30 NOTE — ED Notes (Signed)
Pt. Talking to TTS and NP in room. 
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
## 2019-08-31 DIAGNOSIS — F4325 Adjustment disorder with mixed disturbance of emotions and conduct: Secondary | ICD-10-CM

## 2019-08-31 NOTE — ED Notes (Signed)
Hourly rounding reveals patient sleeping in room. No complaints, stable, in no acute distress. Q15 minute rounds and monitoring via Security Cameras to continue. 
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
## 2019-08-31 NOTE — ED Notes (Signed)
Patient talking to psych NP and TTS

## 2019-08-31 NOTE — ED Provider Notes (Signed)
-----------------------------------------   6:55 AM on 08/31/2019 -----------------------------------------   Blood pressure (!) 130/93, pulse 86, temperature 98.5 F (36.9 C), temperature source Oral, height 5\' 8"  (1.727 m), weight 74.4 kg, SpO2 99 %.  The patient is sleeping at this time.  There have been no acute events since the last update.  Awaiting disposition plan from Behavioral Medicine and/or Social Work team(s).   Paulette Blanch, MD 08/31/19 [phone]

## 2019-08-31 NOTE — ED Notes (Addendum)
Patient discharged home, patient received discharge papers. Patient received belongings and verbalized he has received all of his belongings. Patient appropriate and cooperative, Denies SI/HI AVH. Vital signs taken. NAD noted.  Patient called [employer] to take him home

## 2019-08-31 NOTE — ED Notes (Signed)
BEHAVIORAL HEALTH ROUNDING Patient sleeping: No. Patient alert and oriented: yes Behavior appropriate: Yes.  ; If no, describe:  Nutrition and fluids offered: yes Toileting and hygiene offered: Yes  Sitter present: q15 minute observations and security monitoring Law enforcement present: Yes    

## 2020-03-01 ENCOUNTER — Emergency Department: Payer: 59

## 2020-03-01 ENCOUNTER — Emergency Department
Admission: EM | Admit: 2020-03-01 | Discharge: 2020-03-01 | Disposition: A | Discharge status: Home / Self Care | Payer: 59 | Attending: Emergency Medicine | Admitting: Emergency Medicine

## 2020-03-01 ENCOUNTER — Other Ambulatory Visit: Payer: Self-pay

## 2020-03-01 ENCOUNTER — Encounter: Payer: Self-pay | Admitting: Emergency Medicine

## 2020-03-01 DIAGNOSIS — Z79899 Other long term (current) drug therapy: Secondary | ICD-10-CM | POA: Diagnosis not present

## 2020-03-01 DIAGNOSIS — R079 Chest pain, unspecified: Secondary | ICD-10-CM

## 2020-03-01 DIAGNOSIS — R0789 Other chest pain: Secondary | ICD-10-CM | POA: Insufficient documentation

## 2020-03-01 LAB — HEPATIC FUNCTION PANEL
ALT: 28 U/L (ref 0–44)
AST: 21 U/L (ref 15–41)
Albumin: 4 g/dL (ref 3.5–5.0)
Alkaline Phosphatase: 66 U/L (ref 38–126)
Bilirubin, Direct: 0.1 mg/dL (ref 0.0–0.2)
Indirect Bilirubin: 0.7 mg/dL (ref 0.3–0.9)
Total Bilirubin: 0.8 mg/dL (ref 0.3–1.2)
Total Protein: 7 g/dL (ref 6.5–8.1)

## 2020-03-01 LAB — BASIC METABOLIC PANEL
Anion gap: 10 (ref 5–15)
BUN: 15 mg/dL (ref 6–20)
CO2: 25 mmol/L (ref 22–32)
Calcium: 9 mg/dL (ref 8.9–10.3)
Chloride: 102 mmol/L (ref 98–111)
Creatinine, Ser: 1.01 mg/dL (ref 0.61–1.24)
GFR calc Af Amer: >60 mL/min (ref >60)
GFR calc non Af Amer: >60 mL/min (ref >60)
Glucose, Bld: 147 mg/dL — ABNORMAL HIGH (ref 70–99)
Potassium: 3.9 mmol/L (ref 3.5–5.1)
Sodium: 137 mmol/L (ref 135–145)

## 2020-03-01 LAB — CBC
HCT: 43.9 % (ref 39.0–52.0)
Hemoglobin: 14.8 g/dL (ref 13.0–17.0)
MCH: 29.2 pg (ref 26.0–34.0)
MCHC: 33.7 g/dL (ref 30.0–36.0)
MCV: 86.8 fL (ref 80.0–100.0)
Platelets: 266 10*3/uL (ref 150–400)
RBC: 5.06 MIL/uL (ref 4.22–5.81)
RDW: 12.7 % (ref 11.5–15.5)
WBC: 4.1 10*3/uL (ref 4.0–10.5)
nRBC: 0 % (ref 0.0–0.2)

## 2020-03-01 LAB — LIPASE, BLOOD: Lipase: 33 U/L (ref 11–51)

## 2020-03-01 LAB — TROPONIN I (HIGH SENSITIVITY)
Troponin I (High Sensitivity): <2 ng/L (ref <18)
Troponin I (High Sensitivity): <2 ng/L (ref <18)

## 2020-03-01 MED ORDER — SODIUM CHLORIDE 0.9% FLUSH: 3.0000 mL | Freq: Once | INTRAVENOUS | Status: DC

## 2020-03-01 NOTE — ED Provider Notes (Signed)
[facility] Emergency Department Provider Note  Time seen: 3:23 PM  I have reviewed the triage vital signs and the nursing notes.   HISTORY  Chief Complaint Chest Pain   HPI Javier Lambert is a 34 y.o. male with a past medical history of PTSD presents emergency department for left chest pain.  According to the patient for the past 2 days he has been experiencing pain in the left chest, somewhat worse with certain movements.  Denies any nausea or diaphoresis.  Patient states he does do a lot of heavy lifting at work but does not recall a specific action that may have injured his chest.  No leg pain or swelling.  Largely negative review of systems.   Past Medical History:  Diagnosis Date  . PTSD (post-traumatic stress disorder)     Patient Active Problem List   Diagnosis Date Noted  . 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 HERNIA REPAIR    . SHOULDER SURGERY      Prior to Admission medications   Medication Sig Start Date End Date Taking? Authorizing Provider  QUEtiapine (SEROQUEL) 100 MG tablet Take 100-200 mg by mouth at bedtime. 02/04/20  Yes [provider]  amphetamine-dextroamphetamine (ADDERALL) 10 MG tablet Take 10 mg by mouth 2 (two) times daily. 08/04/19   [provider]  LORazepam (ATIVAN) 1 MG tablet Take 1 mg by mouth 4 (four) times daily as needed for anxiety. 08/04/19   [provider]    No Known Allergies  No family history on file.  Social History Social History   Tobacco Use  . Smoking status: Never Smoker  . Smokeless tobacco: Never Used  Substance Use Topics  . Alcohol use: No  . Drug use: Yes    Types: Marijuana    Comment: reports using pain pills    Review of Systems Constitutional: Negative for fever. Cardiovascular: Intermittent left chest pain Respiratory: Negative for shortness of  breath. Gastrointestinal: Negative for abdominal pain Musculoskeletal: Negative for musculoskeletal complaints Neurological: Negative for headache All other ROS negative  ____________________________________________   PHYSICAL EXAM:  VITAL SIGNS: ED Triage Vitals  Enc Vitals Group     BP 03/01/20 1230 128/78     Pulse Rate 03/01/20 1230 91     Resp 03/01/20 1230 16     Temp 03/01/20 1230 98.4 F (36.9 C)     Temp Source 03/01/20 1230 Oral     SpO2 03/01/20 1230 99 %     Weight 03/01/20 1227 164 lb 0.4 oz (74.4 kg)     Height 03/01/20 1227 5\' 8"  (1.727 m)     Head Circumference --      Peak Flow --      Pain Score 03/01/20 1227 9     Pain Loc --      Pain Edu? --      Excl. in [place]? --     Constitutional: Alert and oriented. Well appearing and in no distress. Eyes: Normal exam ENT      Head: Normocephalic and atraumatic.      Mouth/Throat: Mucous membranes are moist. Cardiovascular: Normal rate, regular rhythm. No murmur.  No chest wall tenderness to palpation. Respiratory: Normal respiratory effort without tachypnea nor retractions. Breath sounds are clear  Gastrointestinal: Soft and nontender. No distention.   Musculoskeletal: Nontender with normal range of motion in all extremities. No lower extremity tenderness or  edema. Neurologic:  Normal speech and language. No gross focal neurologic deficits  Skin:  Skin is warm, dry and intact.  Psychiatric: Mood and affect are normal.   ____________________________________________    EKG  EKG viewed and interpreted by myself shows a normal sinus rhythm at 90 bpm with a narrow QRS, normal axis, normal intervals, no concerning ST changes.  ____________________________________________    RADIOLOGY  Negative chest x-ray  ____________________________________________   INITIAL IMPRESSION / ASSESSMENT AND PLAN / ED COURSE  Pertinent labs & imaging results that were available during my care of the patient were reviewed by  me and considered in my medical decision making (see chart for details).   Patient presents to the emergency department for left-sided chest pain.  Ongoing over the past 48 hours.  Overall the patient appears well.  No acute distress.  Differential would include ACS, pneumonia, chest wall pain, pneumothorax, costochondritis or muscle strain.  Patient has reassuring physical exam lab work largely within normal limits including a negative troponin.  Chest x-ray is normal.  EKG is reassuring.  Repeat troponin is pending.  If repeat troponin is negative anticipate likely discharge home with PCP follow-up.  Patient agreeable to plan of care.  Repeat troponin remains negative.  Patient appears well.  We will discharge home with PCP follow-up.  Javier Lambert was evaluated in Emergency Department on 03/01/2020 for the symptoms described in the history of present illness. He was evaluated in the context of the global COVID-19 pandemic, which necessitated consideration that the patient might be at risk for infection with the SARS-CoV-2 virus that causes COVID-19. Institutional protocols and algorithms that pertain to the evaluation of patients at risk for COVID-19 are in a state of rapid change based on information released by regulatory bodies including the CDC and federal and state organizations. These policies and algorithms were followed during the patient's care in the ED.  ____________________________________________   FINAL CLINICAL IMPRESSION(S) / ED DIAGNOSES  Left chest pain   Javier Antis, MD 03/01/20 1533

## 2020-03-01 NOTE — ED Notes (Signed)
NAD noted at time of D/C. Pt denies questions or concerns. Pt ambulatory to the lobby at this time.  

## 2020-03-01 NOTE — ED Triage Notes (Signed)
Left sided chest pain radiating to left back x 1 day.  AAOx3.  Skin warm and dry. NAD

## 2021-08-03 IMAGING — CR DG CHEST 2V
2 series · 2 of 2 positions shown · non-contrast
Comparison: 02/11/2012

CLINICAL DATA: LEFT side chest pain radiating to LEFT back for 1
day, sharp pain when taking a breath and with raising arms above
head

EXAM:
CHEST - 2 VIEW

[chest pa]
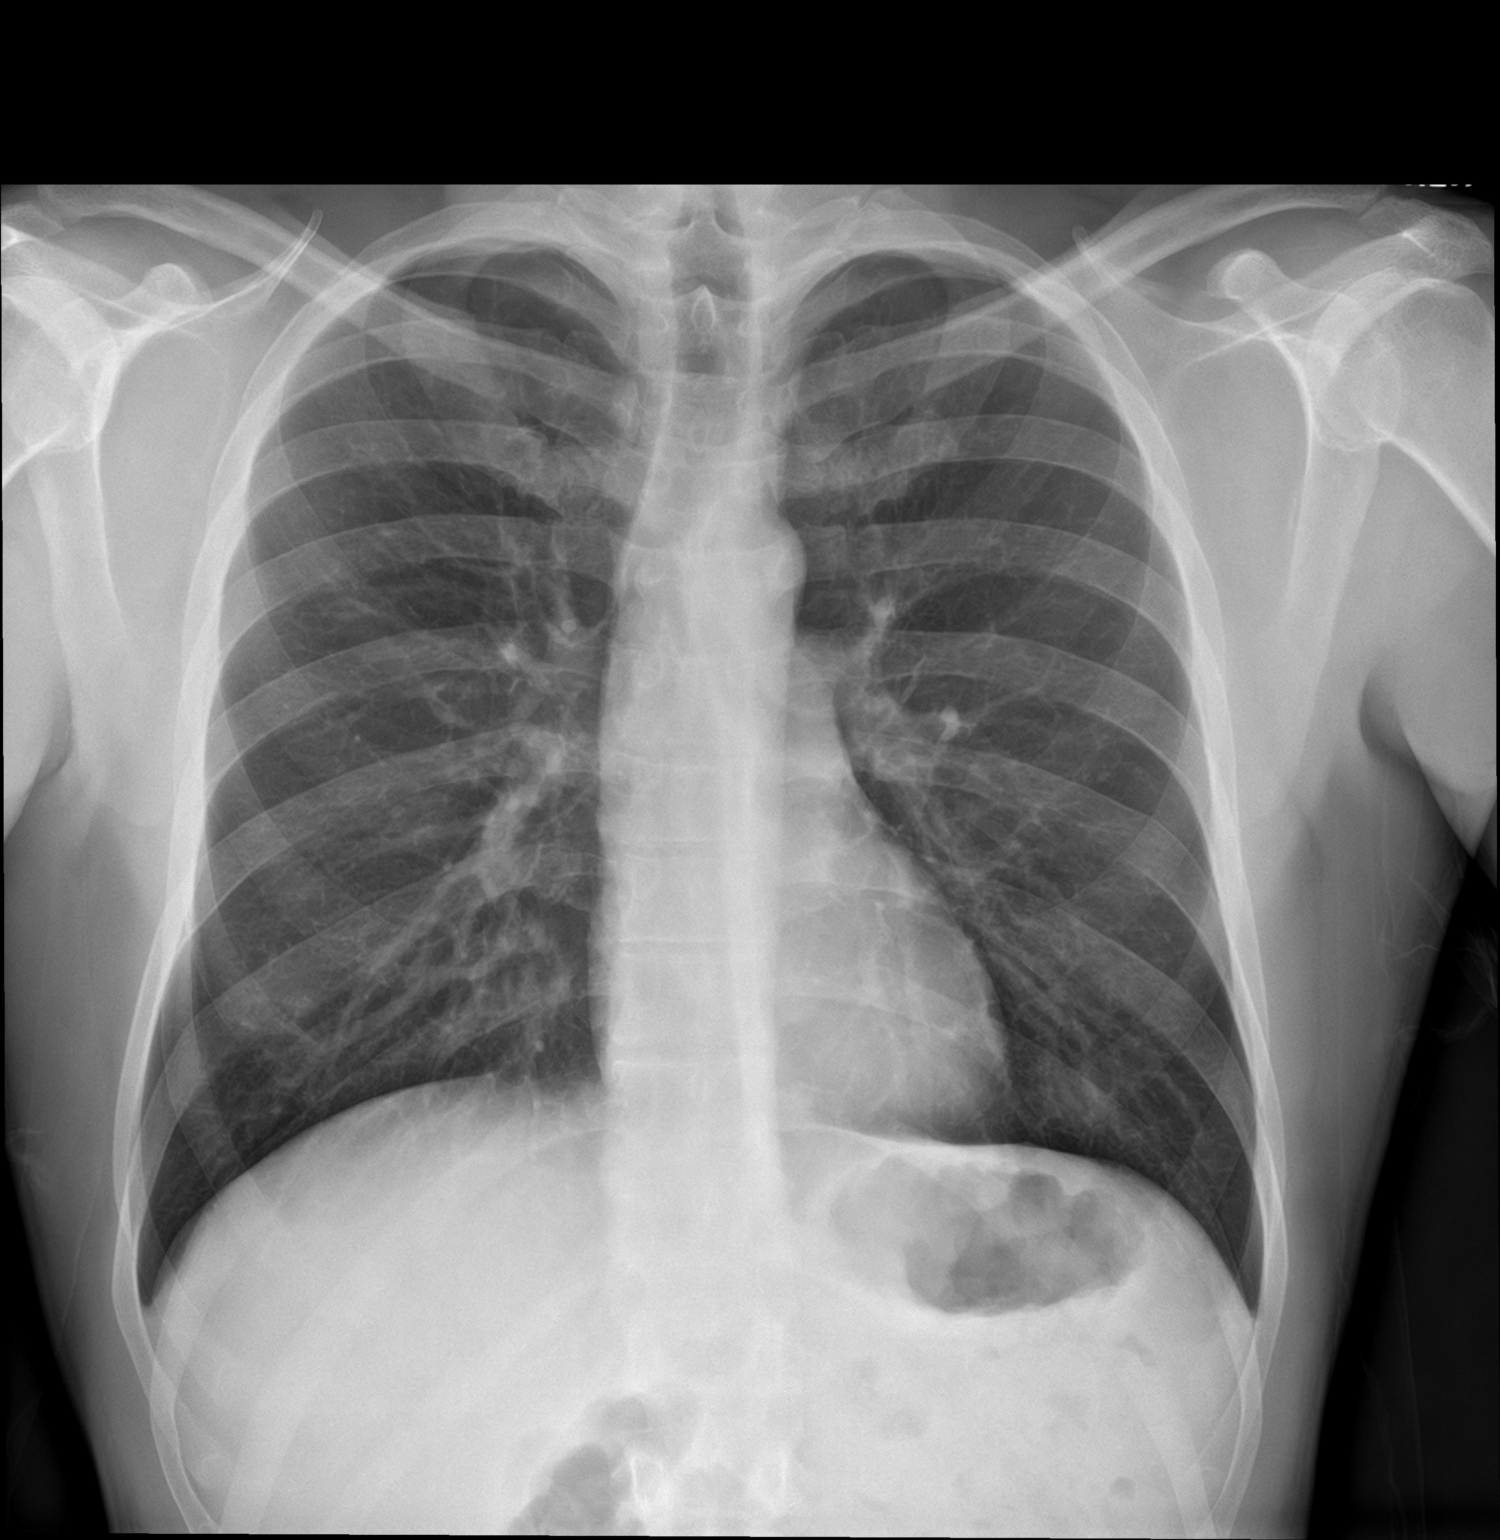

[chest lat]
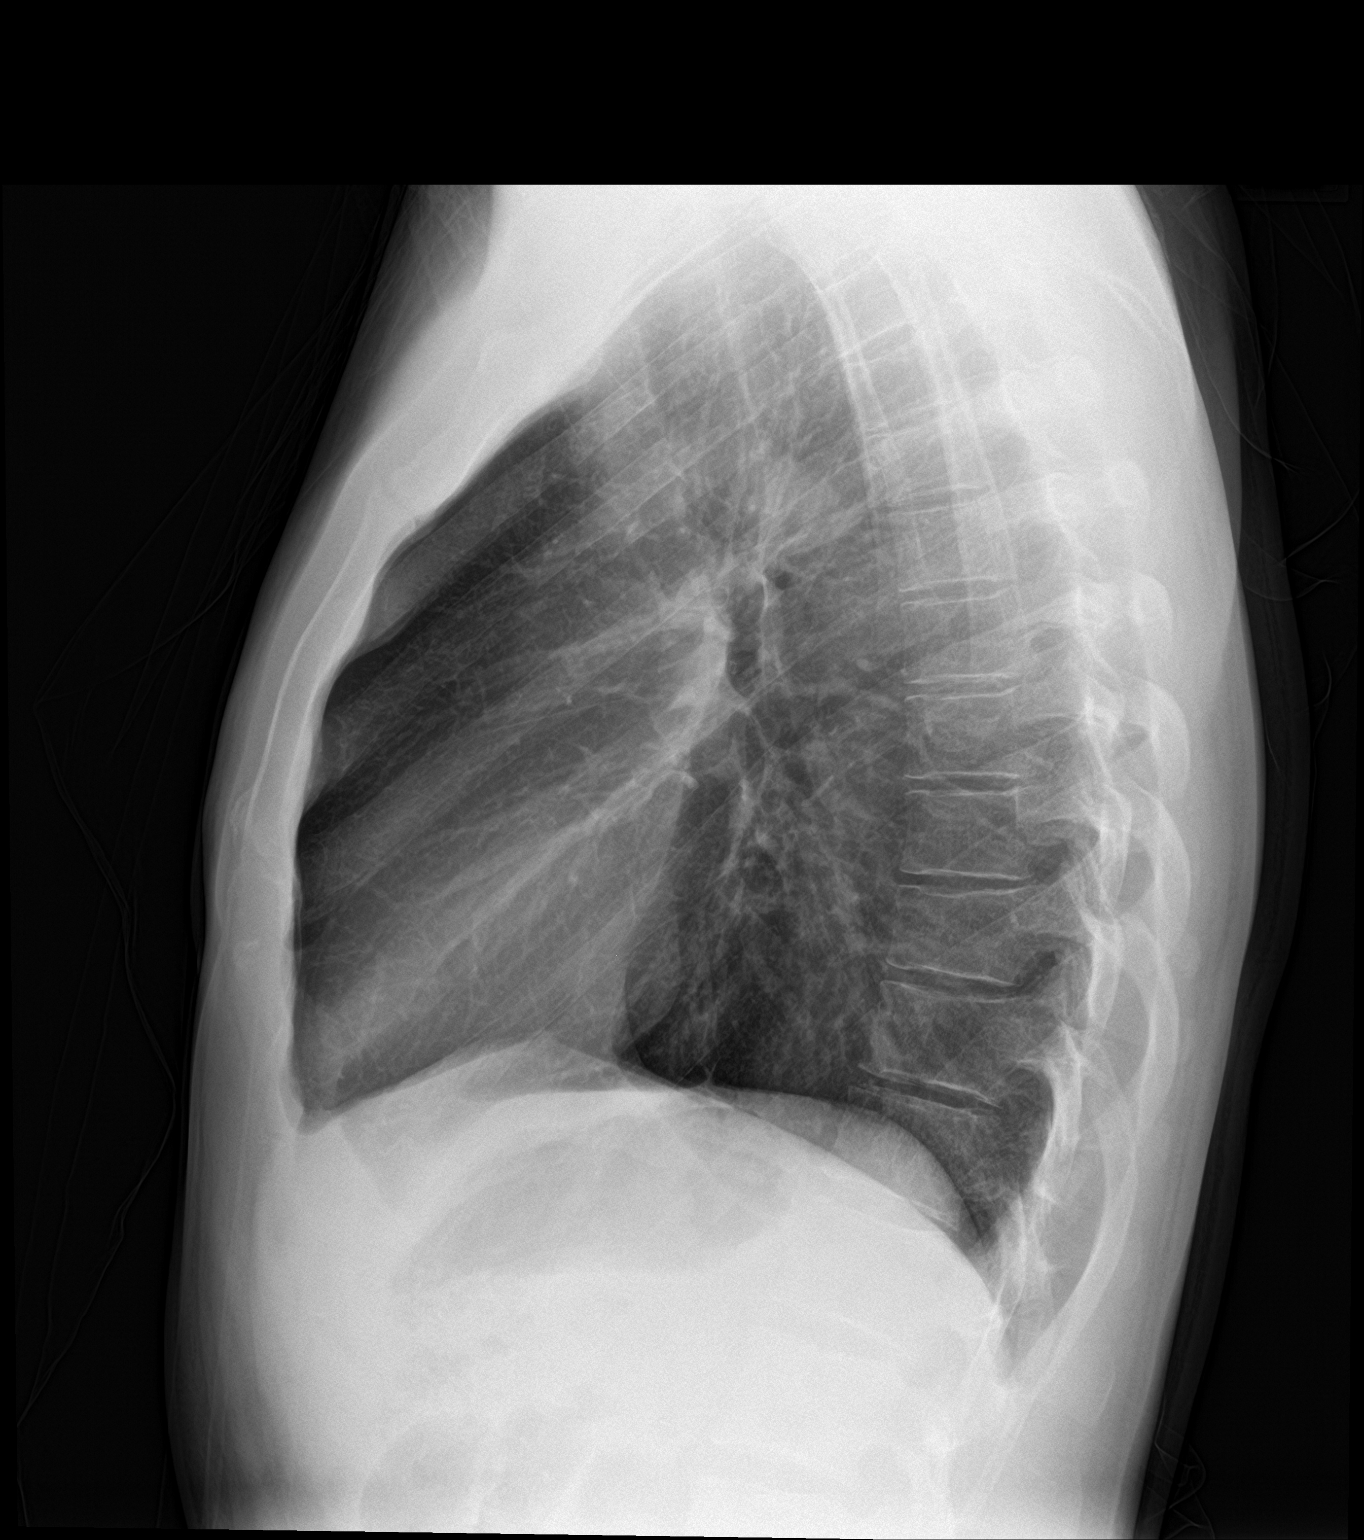

[2 of 2 positions shown; findings below may reference images not displayed]

FINDINGS: Normal heart size, mediastinal contours, and pulmonary vascularity.

Lungs clear.

No pulmonary infiltrate, pleural effusion or pneumothorax.

Bones unremarkable.
IMPRESSION: No acute abnormalities.
